# Patient Record
Sex: Male | Born: 1990 | Race: Black or African American | Hispanic: No | Marital: Single | Smoking: Former smoker
Health system: Southern US, Community
[De-identification: ages and names within clinical notes are randomized; demographics above are authoritative.]

## PROBLEM LIST (undated history)

## (undated) DIAGNOSIS — J020 Streptococcal pharyngitis: Secondary | ICD-10-CM

---

## 2011-08-01 ENCOUNTER — Emergency Department (HOSPITAL_COMMUNITY)
Admission: EM | Admit: 2011-08-01 | Discharge: 2011-08-01 | Disposition: A | Discharge status: Home / Self Care | Payer: BC Managed Care – PPO | Attending: Emergency Medicine | Admitting: Emergency Medicine

## 2011-08-01 ENCOUNTER — Encounter: Payer: Self-pay | Admitting: *Deleted

## 2011-08-01 DIAGNOSIS — R599 Enlarged lymph nodes, unspecified: Secondary | ICD-10-CM | POA: Insufficient documentation

## 2011-08-01 DIAGNOSIS — IMO0001 Reserved for inherently not codable concepts without codable children: Secondary | ICD-10-CM | POA: Insufficient documentation

## 2011-08-01 DIAGNOSIS — R070 Pain in throat: Secondary | ICD-10-CM | POA: Insufficient documentation

## 2011-08-01 DIAGNOSIS — R509 Fever, unspecified: Secondary | ICD-10-CM | POA: Insufficient documentation

## 2011-08-01 DIAGNOSIS — J3489 Other specified disorders of nose and nasal sinuses: Secondary | ICD-10-CM | POA: Insufficient documentation

## 2011-08-01 DIAGNOSIS — J351 Hypertrophy of tonsils: Secondary | ICD-10-CM | POA: Insufficient documentation

## 2011-08-01 DIAGNOSIS — J020 Streptococcal pharyngitis: Secondary | ICD-10-CM | POA: Insufficient documentation

## 2011-08-01 HISTORY — DX: Streptococcal pharyngitis: J02.0

## 2011-08-01 MED ORDER — PENICILLIN G BENZATHINE 1200000 UNIT/2ML IM SUSP
1.2000 10*6.[IU] | INTRAMUSCULAR | Status: AC
  Administered 2011-08-01: 1.2 10*6.[IU] via INTRAMUSCULAR
  Filled 2011-08-01: qty 2

## 2011-08-01 MED ORDER — HYDROCODONE-ACETAMINOPHEN 7.5-500 MG/15ML PO SOLN
10.0000 mL | Freq: Once | ORAL | Status: AC
  Administered 2011-08-01: 10 mL via ORAL
  Filled 2011-08-01: qty 15

## 2011-08-01 MED ORDER — DEXAMETHASONE SODIUM PHOSPHATE 10 MG/ML IJ SOLN
10.0000 mg | Freq: Once | INTRAMUSCULAR | Status: AC
  Administered 2011-08-01: 10 mg via INTRAMUSCULAR
  Filled 2011-08-01: qty 1

## 2011-08-01 NOTE — ED Notes (Signed)
Pt was diagnosed with strep throat.  Pt on meds for same.  Pt states that throat pain is getting worse.

## 2011-08-01 NOTE — ED Notes (Signed)
Pt states that he has had sore throat for the past week or so, seen and dx'ed with strep and given keflex and ibuprofen. States that over the past few hours his throat has been getting more swollen and sore

## 2011-08-01 NOTE — ED Provider Notes (Signed)
History     CSN: [id number] Arrival date & time: 08/01/2011 12:42 PM   First MD Initiated Contact with Patient 08/01/11 1311      Chief Complaint  Patient presents with  . Sore Throat    (Consider location/radiation/quality/duration/timing/severity/associated sxs/prior treatment) Patient is a 20 y.o. male presenting with pharyngitis. The history is provided by the patient.  Sore Throat The current episode started in the past 7 days. The problem occurs constantly. The problem has been gradually worsening. Associated symptoms include chills, congestion, a fever, myalgias, a sore throat and swollen glands. Pertinent negatives include no abdominal pain, neck pain, rash or vomiting. The symptoms are aggravated by swallowing. He has tried NSAIDs for the symptoms. The treatment provided no relief.  Pt was seen for his sore throat 2 days ago at his [facility].Wast diagnosed with strep throat. Treated with keflex and ibuprofen. Pt states symptoms worsening. Denies inability to swallow. Denies any other complaints.   Past Medical History  Diagnosis Date  . Strep throat     History reviewed. No pertinent past surgical history.  History reviewed. No pertinent family history.  History  Substance Use Topics  . Smoking status: Former Games developer  . Smokeless tobacco: Not on file  . Alcohol Use: No      Review of Systems  Constitutional: Positive for fever and chills.  HENT: Positive for congestion, sore throat and voice change. Negative for ear pain, drooling, trouble swallowing, neck pain and dental problem.   Eyes: Negative.   Respiratory: Negative.   Cardiovascular: Negative.   Gastrointestinal: Negative.  Negative for vomiting and abdominal pain.  Genitourinary: Negative.   Musculoskeletal: Positive for myalgias.  Skin: Negative.  Negative for rash.  Neurological: Negative.   Psychiatric/Behavioral: Negative.     Allergies  Review of patient's allergies indicates no known  allergies.  Home Medications   Current Outpatient Rx  Name Route Sig Dispense Refill  . CEPHALEXIN 500 MG PO CAPS Oral Take 500 mg by mouth 2 (two) times daily. Started on 11/29. Takes for 7 days    . IBUPROFEN 600 MG PO TABS Oral Take 600 mg by mouth every 8 (eight) hours as needed. For pain      BP 142/70  Pulse 100  Temp(Src) 98.7 F (37.1 C) (Oral)  Resp 18  SpO2 97%  Physical Exam  Constitutional: He is oriented to person, place, and time. He appears well-developed and well-nourished. No distress.  HENT:  Head: Normocephalic and atraumatic.  Right Ear: Tympanic membrane, external ear and ear canal normal.  Left Ear: Tympanic membrane, external ear and ear canal normal.  Nose: Nose normal.  Mouth/Throat: Uvula is midline and mucous membranes are normal. No uvula swelling. Oropharyngeal exudate present. No tonsillar abscesses.       Tonsils swollen, erythemous, exudate present. Uvula midline, no obvious abscess right now  Cardiovascular: Normal rate, regular rhythm and normal heart sounds.   Pulmonary/Chest: Effort normal and breath sounds normal. No respiratory distress.  Abdominal: Soft. Bowel sounds are normal. He exhibits no distension.  Musculoskeletal: Normal range of motion.  Neurological: He is oriented to person, place, and time.  Skin: Skin is warm and dry. No rash noted.    ED Course  Procedures (including critical care time)  Pt with strep pharyngitis. Pt able to drink and swallow without difficulty. No swelling to the uvula, uvula is midline. Pt on keflex, will administer a dose of bacillin, treat with pain medications and steroids.   MDM  Lottie Mussel, PA 08/01/11 1505

## 2011-08-01 NOTE — ED Provider Notes (Signed)
Medical screening examination/treatment/procedure(s) were performed by non-physician practitioner and as supervising physician I was immediately available for consultation/collaboration.   Gwyneth Sprout, MD 08/01/11 1958

## 2011-08-01 NOTE — ED Notes (Signed)
Called pharmacy about meds, sending them shortly

## 2014-04-19 ENCOUNTER — Ambulatory Visit
Admission: RE | Admit: 2014-04-19 | Discharge: 2014-04-19 | Disposition: A | Discharge status: Home / Self Care | Payer: BC Managed Care – PPO | Source: Ambulatory Visit | Attending: Emergency Medicine | Admitting: Emergency Medicine

## 2014-04-19 ENCOUNTER — Other Ambulatory Visit: Payer: Self-pay | Admitting: Emergency Medicine

## 2014-04-19 DIAGNOSIS — R05 Cough: Secondary | ICD-10-CM

## 2014-04-19 DIAGNOSIS — R059 Cough, unspecified: Secondary | ICD-10-CM

## 2015-02-13 ENCOUNTER — Emergency Department (HOSPITAL_COMMUNITY)
Admission: EM | Admit: 2015-02-13 | Discharge: 2015-02-13 | Disposition: A | Discharge status: Home / Self Care | Payer: BLUE CROSS/BLUE SHIELD | Attending: Emergency Medicine | Admitting: Emergency Medicine

## 2015-02-13 ENCOUNTER — Emergency Department (HOSPITAL_COMMUNITY): Payer: BLUE CROSS/BLUE SHIELD

## 2015-02-13 ENCOUNTER — Encounter (HOSPITAL_COMMUNITY): Payer: Self-pay | Admitting: Emergency Medicine

## 2015-02-13 DIAGNOSIS — Y92410 Unspecified street and highway as the place of occurrence of the external cause: Secondary | ICD-10-CM | POA: Diagnosis not present

## 2015-02-13 DIAGNOSIS — Y998 Other external cause status: Secondary | ICD-10-CM | POA: Insufficient documentation

## 2015-02-13 DIAGNOSIS — Z87891 Personal history of nicotine dependence: Secondary | ICD-10-CM | POA: Insufficient documentation

## 2015-02-13 DIAGNOSIS — S3992XA Unspecified injury of lower back, initial encounter: Secondary | ICD-10-CM | POA: Diagnosis present

## 2015-02-13 DIAGNOSIS — S39012A Strain of muscle, fascia and tendon of lower back, initial encounter: Secondary | ICD-10-CM | POA: Insufficient documentation

## 2015-02-13 DIAGNOSIS — Y9389 Activity, other specified: Secondary | ICD-10-CM | POA: Diagnosis not present
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
## 2015-02-13 NOTE — Discharge Instructions (Signed)
Please call your doctor for a followup appointment within 24-48 hours. When you talk to your doctor please let them know that you were seen in the emergency department and have them acquire all of your records so that they can discuss the findings with you and formulate a treatment plan to fully care for your new and ongoing problems. Please call and setup an appointment with your primary care provider Please call and set up an appointment with orthopedics Please rest and stay hydrated Please avoid any physical or strenuous activity, please no heavy lifting for the next couple of days Please apply warm compressions and massage with icy hot ointment Please continue to monitor symptoms closely and if symptoms are to worsen or change (fever greater than 101, chills, sweating, nausea, vomiting, chest pain, shortness of breathe, difficulty breathing, weakness, numbness, tingling, worsening or changes to pain pattern, fall, injury, loss of sensation, numbness, tingling, foot dragging, inability to control urine or bowel movements, stomach pain, blood in the stools, black tarry stools, blood in the urine, decreased urination, decreased stream) please report back to the Emergency Department immediately.   Lumbosacral Strain Lumbosacral strain is a strain of any of the parts that make up your lumbosacral vertebrae. Your lumbosacral vertebrae are the bones that make up the lower third of your backbone. Your lumbosacral vertebrae are held together by muscles and tough, fibrous tissue (ligaments).  CAUSES  A sudden blow to your back can cause lumbosacral strain. Also, anything that causes an excessive stretch of the muscles in the low back can cause this strain. This is typically seen when people exert themselves strenuously, fall, lift heavy objects, bend, or crouch repeatedly. RISK FACTORS  Physically demanding work.  Participation in pushing or pulling sports or sports that require a sudden twist of the back  (tennis, golf, baseball).  Weight lifting.  Excessive lower back curvature.  Forward-tilted pelvis.  Weak back or abdominal muscles or both.  Tight hamstrings. SIGNS AND SYMPTOMS  Lumbosacral strain may cause pain in the area of your injury or pain that moves (radiates) down your leg.  DIAGNOSIS Your health care provider can often diagnose lumbosacral strain through a physical exam. In some cases, you may need tests such as X-ray exams.  TREATMENT  Treatment for your lower back injury depends on many factors that your clinician will have to evaluate. However, most treatment will include the use of anti-inflammatory medicines. HOME CARE INSTRUCTIONS   Avoid hard physical activities (tennis, racquetball, waterskiing) if you are not in proper physical condition for it. This may aggravate or create problems.  If you have a back problem, avoid sports requiring sudden body movements. Swimming and walking are generally safer activities.  Maintain good posture.  Maintain a healthy weight.  For acute conditions, you may put ice on the injured area.  Put ice in a plastic bag.  Place a towel between your skin and the bag.  Leave the ice on for 20 minutes, 2-3 times a day.  When the low back starts healing, stretching and strengthening exercises may be recommended. SEEK MEDICAL CARE IF:  Your back pain is getting worse.  You experience severe back pain not relieved with medicines. SEEK IMMEDIATE MEDICAL CARE IF:   You have numbness, tingling, weakness, or problems with the use of your arms or legs.  There is a change in bowel or bladder control.  You have increasing pain in any area of the body, including your belly (abdomen).  You notice shortness of  breath, dizziness, or feel faint.  You feel sick to your stomach (nauseous), are throwing up (vomiting), or become sweaty.  You notice discoloration of your toes or legs, or your feet get very cold. MAKE SURE YOU:    Understand these instructions.  Will watch your condition.  Will get help right away if you are not doing well or get worse. Document Released: 05/27/2005 Document Revised: 08/22/2013 Document Reviewed: 04/05/2013 [facility] Patient Information 2015 [place], [place]. This information is not intended to replace advice given to you by your health care provider. Make sure you discuss any questions you have with your health care provider.   Emergency Department Resource Guide 1) Find a Doctor and Pay Out of Pocket Although you won't have to find out who is covered by your insurance plan, it is a good idea to ask around and get recommendations. You will then need to call the office and see if the doctor you have chosen will accept you as a new patient and what types of options they offer for patients who are self-pay. Some doctors offer discounts or will set up payment plans for their patients who do not have insurance, but you will need to ask so you aren't surprised when you get to your appointment.  2) Contact Your Local Health Department Not all health departments have doctors that can see patients for sick visits, but many do, so it is worth a call to see if yours does. If you don't know where your local health department is, you can check in your phone book. The CDC also has a tool to help you locate your state's health department, and many state websites also have listings of all of their local health departments.  3) Find a Walk-in Clinic If your illness is not likely to be very severe or complicated, you may want to try a walk in clinic. These are popping up all over the country in pharmacies, drugstores, and shopping centers. They're usually staffed by nurse practitioners or physician assistants that have been trained to treat common illnesses and complaints. They're usually fairly quick and inexpensive. However, if you have serious medical issues or chronic medical problems, these are  probably not your best option.  No Primary Care Doctor: - Call Health Connect at  [phone] - they can help you locate a primary care doctor that  accepts your insurance, provides certain services, etc. - Physician Referral Service- [phone]  Chronic Pain Problems: Organization         Address  Phone   Notes  [facility]  [phone] Patients need to be referred by their primary care doctor.   Medication Assistance: Organization         Address  Phone   Notes  [facility] Medication [facility] [address] [place], [place] [address] [phone] --Must be a resident of [facility] -- Must have NO insurance coverage whatsoever (no Medicaid/ Medicare, etc.) -- The pt. MUST have a primary care doctor that directs their care regularly and follows them in the community   [facility]ist  [phone]   [employer]  [phone]    Agencies that provide inexpensive medical care: Organization         Address  Phone   Notes  [facility]  [phone]   [facility]    [phone]   [facility] [address] [place], [place] [address] [phone]  [facility] [address] [place], [place] [phone]   [facility]anned Parenthood    [phone]   [facility]    [phone]   [facility]  [address], [place] Phone:  [phone], Fax:  [phone] Hours of Operation:  9 am - 6 pm, M-F.  Also accepts Medicaid/Medicare and self-pay.  [facility]  [address], [place] Phone: [phone], Fax: [phone]. Hours of Operation:  8:30 am - 5:30 pm, M-F.  Also accepts Medicaid and self-pay.  [facility] [place] [address] [place] Phone: [phone]   [facility] [address] Natasha Bence [place], [place] [phone], Ext. [phone] Mondays & Thursdays:  7-9 AM.  First 15 patients are seen on a first come, first serve basis.    Medicaid-accepting Cent[employer]are Providers:  Organization         Address  Phone   Notes  [facility] [address], [place] [phone] Also accepts self-pay patients.  [facility] [address] Laurell Josephs [place], [place]  [phone]   [facility] [address], [place] [phone]   [facility] [address] [place] [phone]   Renaye Rakers [address], [place]   [phone] Only accepts [place] Access [place] patients after they have their name applied to their card.   Self-Pay (no insurance) in [facility]:  Organization         Address  Phone   Notes  Sickle Cell Patients, [facility] [address] [place], [place] [phone]   [facility] [address] [place], [place] [phone]   [facility] [place]  [address],  [phone]   [facility]/Dr. Osei-Bonsu  [address] [place] or [address], [place] [phone] Phone number for both [place] and [place] locations is the same.  [facility] and [facility] [address] [place] [phone]   [facility] [address], [place] or [address] [phone]   [facility] [address], [place] [phone], phone; [phone], fax Sees patients 1st and 3rd Saturday of every month.  Must not qualify for public or private insurance (i.e. Medicaid, Medicare, College Health Choice, Veterans' Benefits)  Household income should be no more than 200% of the poverty level The clinic cannot treat you if you are pregnant or think you are pregnant  Sexually transmitted diseases are not treated at the clinic.     Dental Care: Organization         Address  Phone  Notes  [facility] Department of [facility] [address] [place], [place] [phone] Accepts children up to age 72 who are enrolled in [place] or [place] Health Choice; pregnant women with a Medicaid card; and children who have applied for Medicaid or [place] Health Choice, but were declined, whose parents can pay a reduced fee at time of service.  [facility] of [facility]  [address], [place] [phone] Accepts children up to age 68 who are enrolled in [place] or [place] Health Choice; pregnant women with a Medicaid card; and  children who have applied for Medicaid or [place] Health Choice, but were declined, whose parents can pay a reduced fee at time of service.  [facility]  [address] [place], [place] [phone] Patients are seen by appointment only. Walk-ins are not accepted. [facility] will see patients 46 years of age and older. Monday - Tuesday (8am-5pm) Most Wednesdays (8:30-5pm) $30 per visit, cash only  [facility] Adult [facility] Access PRO[facility]  [address], [facility] [phone] Patients are seen by appointment only. Walk-ins are not accepted. [facility] will see patients 2 years of age and older. One Wednesday Evening (Monthly: Volunteer Based).  $30 per visit, cash only  [employer]  [phone] for adults; Children under age 24, call Graduate Pediatric Dentistry at [phone]. Children aged 85-14, please call [phone] to request a pediatric application.  Dental services are provided in all areas of dental care including fillings, crowns and bridges, complete and partial dentures, implants, gum treatment, root canals, and extractions. Preventive care is also provided. Treatment is provided to both adults and children. Patients are selected via a lottery and there is often a waiting  list.   [facility] [address] [place]  [phone] [email or web address]   [facility] [address] [place], [place] [phone], Ext. [phone] Second and Fourth Thursday of each month, opens at 6:30 AM; Clinic ends at 9 AM.  Patients are seen on a first-come first-served basis, and a limited number are seen during each clinic.   [facility]  [address] Ether Griffins [place], [place] [phone]   Eligibility Requirements You must have lived in [place], [place], or [place] counties for at least the last three months.   You cannot be eligible for state or federal sponsored [place], including [employer], [place], or [employer].   You generally cannot be eligible for healthcare insurance through your employer.    How to apply: Eligibility screenings are held every Tuesday and Wednesday afternoon from 1:00 pm until 4:00 pm. You do not need an appointment for the interview!  [facility] [address], [place], [place] [phone]   [facility]  [phone]   [facility] Health Department  [phone]   [facility] Health Department  [phone]    Behavioral Health Resources in the Community: Intensive Outpatient Programs Organization         Address  Phone  Notes  [facility] [address] [place], [place] [phone]   [facility] Outpatient [address] [place], [place] [phone]   [facility] [address], [place], [place]  [phone]   [facility] Mental Health [address]  [place], [place] [phone] or [phone]   Substance Abuse Resources Organization         Address  Phone  Notes  [facility] Drug Services  [phone]   [facility]  [phone]   [facility] [place]  [phone]   Floydene Flock  [phone]   Residential & Outpatient Substance Abuse Program   [phone]   Psychological Services Organization         Address  Phone  Notes  [facility] Behavioral Health  [phone]   [facility]  [phone]   [facility] [address], [place] [phone] or [phone]    Mobile Crisis Teams Organization  Address  Phone  Notes  [facility], Mobile Crisis Care Unit  [phone]   [facility]  [address] [place], [place]   [facility] [address] [place] [phone]    Self-Help/Support Groups Organization         Address  Phone             Notes  [place] [facility] [place] - variety of support groups  [facility] Call for more information  Narcotics Anonymous (NA), [facility] [address], [employer] [place]  2 meetings at this location   Special educational needs teacher         Address  Phone  Notes  [facility] [place],    [place]  [phone]   [facility]  [address] [place] [phone], [place], [place]   [place], [place] [phone] Admissions: 8am-3pm M-F  Incentives Substance [place] [address],    [place], [place] [phone]   [facility] [address] [place], [place], [place]   [facility] [address]  [place], [place]   [facility] - Intensive Outpatient [address] Kristeen Mans [address], [place], [place]   [facility] [place]  [place], [place] [phone] or [phone]   [facility] ([facility]) [address] [place], [place] Accepts Medicaid  [facility] [place] [address]  [place] [phone] Substance Abuse/Addiction Treatment   [facility] Organization          Address  Phone  Notes  [facility]  6166443771   Domenic Schwab, PhD [address] Arlis [place], [place]   [phone] or [phone]   [place], [place] [phone]   [facility] [address] [place], [place] [phone] Insurance/Medicaid/sponsorship through [facility] and Families [address], [address] [place]                                    [place], [place] [phone] [address], [place] [phone]    Dr. Adele Schilder  [phone]   [facility] [place] [facility] Dept. 1) [address] [place] 2) [place] 3)  [place] [address], [place] [phone]  [phone]   [place] [phone] or [phone] (After Hours)
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
